# Patient Record
Sex: Female | Born: 1980 | Race: White | Hispanic: No | Marital: Married | Smoking: Never smoker
Health system: Southern US, Community
[De-identification: ages and names within clinical notes are randomized; demographics above are authoritative.]

---

## 2008-01-24 ENCOUNTER — Emergency Department: Payer: Self-pay | Admitting: Emergency Medicine

## 2009-04-20 ENCOUNTER — Ambulatory Visit: Payer: Self-pay | Admitting: Obstetrics & Gynecology

## 2009-04-21 ENCOUNTER — Observation Stay: Payer: Self-pay

## 2009-04-29 ENCOUNTER — Inpatient Hospital Stay: Payer: Self-pay

## 2011-05-31 ENCOUNTER — Ambulatory Visit: Payer: Self-pay

## 2011-06-01 ENCOUNTER — Inpatient Hospital Stay: Payer: Self-pay

## 2011-06-05 LAB — PATHOLOGY REPORT

## 2013-03-07 ENCOUNTER — Ambulatory Visit: Payer: Self-pay | Admitting: Family Medicine

## 2013-03-07 LAB — URINALYSIS, COMPLETE
Bilirubin,UR: NEGATIVE
Protein: 100
Specific Gravity: >=1.03 (ref 1.003–1.030)
WBC UR: >30 /HPF (ref 0–5)

## 2013-03-09 LAB — URINE CULTURE

## 2013-05-19 ENCOUNTER — Ambulatory Visit: Payer: Self-pay

## 2013-05-19 LAB — URINALYSIS, COMPLETE

## 2013-05-21 LAB — URINE CULTURE

## 2014-10-02 ENCOUNTER — Ambulatory Visit: Payer: Self-pay | Admitting: Family Medicine

## 2015-04-17 ENCOUNTER — Encounter: Payer: Self-pay | Admitting: Gynecology

## 2015-04-17 ENCOUNTER — Ambulatory Visit
Admission: EM | Admit: 2015-04-17 | Discharge: 2015-04-17 | Disposition: A | Discharge status: Home / Self Care | Payer: BLUE CROSS/BLUE SHIELD | Attending: Family Medicine | Admitting: Family Medicine

## 2015-04-17 DIAGNOSIS — B950 Streptococcus, group A, as the cause of diseases classified elsewhere: Secondary | ICD-10-CM | POA: Insufficient documentation

## 2015-04-17 DIAGNOSIS — J020 Streptococcal pharyngitis: Secondary | ICD-10-CM | POA: Diagnosis not present

## 2015-04-17 DIAGNOSIS — J029 Acute pharyngitis, unspecified: Secondary | ICD-10-CM | POA: Diagnosis present

## 2015-04-17 LAB — RAPID STREP SCREEN (MED CTR MEBANE ONLY): STREPTOCOCCUS, GROUP A SCREEN (DIRECT): POSITIVE — AB

## 2015-04-17 MED ORDER — LIDOCAINE VISCOUS 2 % MT SOLN: OROMUCOSAL | Status: DC

## 2015-04-17 MED ORDER — HYDROCODONE-ACETAMINOPHEN 5-325 MG PO TABS: 1.0000 | ORAL_TABLET | Freq: Four times a day (QID) | ORAL | Status: DC | PRN

## 2015-04-17 MED ORDER — PENICILLIN G BENZATHINE 1200000 UNIT/2ML IM SUSP
1.2000 10*6.[IU] | Freq: Once | INTRAMUSCULAR | Status: AC
  Administered 2015-04-17: 1.2 10*6.[IU] via INTRAMUSCULAR

## 2015-04-17 NOTE — ED Notes (Signed)
Patient c/o sore throat x 3 days.  

## 2015-04-17 NOTE — ED Provider Notes (Signed)
CSN: [id number]     Arrival date & time 04/17/15  6203 History   First MD Initiated Contact with Patient 04/17/15 1018     Chief Complaint  Patient presents with  . Sore Throat   (Consider location/radiation/quality/duration/timing/severity/associated sxs/prior Treatment) HPI Comments: 34 yo female with a 3 days h/o sore throat. Complains of pain with swallowing. Denies any difficulty swallowing, trouble breathing, fevers, chills, nasal congestion or cough.   Patient is a 34 y.o. female presenting with pharyngitis. The history is provided by the patient.  Sore Throat    History reviewed. No pertinent past medical history. Past Surgical History  Procedure Laterality Date  . Cesarean section      x2   No family history on file. History  Substance Use Topics  . Smoking status: Never Smoker   . Smokeless tobacco: Not on file  . Alcohol Use: No   OB History    No data available     Review of Systems  Allergies  Review of patient's allergies indicates no known allergies.  Home Medications   Prior to Admission medications   Medication Sig Start Date End Date Taking? Authorizing Provider  HYDROcodone-acetaminophen (NORCO/VICODIN) 5-325 MG per tablet Take 1-2 tablets by mouth every 6 (six) hours as needed. 04/17/15   Payton Mccallum, MD  lidocaine (XYLOCAINE) 2 % solution 20 mls gargle and spit q 6 hours prn 04/17/15   Payton Mccallum, MD   BP 121/67 mmHg  Pulse 97  Temp(Src) 97.8 F (36.6 C) (Oral)  Ht 5\' 6"  (1.676 m)  Wt 280 lb (127.007 kg)  BMI 45.21 kg/m2  SpO2 99%  LMP 03/27/2015 (Approximate) Physical Exam  Constitutional: She appears well-developed and well-nourished. No distress.  HENT:  Head: Normocephalic and atraumatic.  Nose: Nose normal.  Mouth/Throat: Mucous membranes are normal. Oropharyngeal exudate and posterior oropharyngeal erythema present. No posterior oropharyngeal edema or tonsillar abscesses.  Neck: Normal range of motion. Neck supple. No tracheal  deviation present. No thyromegaly present.  Lymphadenopathy:    She has no cervical adenopathy.  Skin: She is not diaphoretic.  Nursing note and vitals reviewed.   ED Course  Procedures (including critical care time) Labs Review Labs Reviewed  RAPID STREP SCREEN (NOT AT [facility]) - Abnormal; Notable for the following:    Streptococcus, Group A Screen (Direct) POSITIVE (*)    All other components within normal limits    Imaging Review No results found.   MDM   1. Strep pharyngitis    Discharge Medication List as of 04/17/2015 10:48 AM    START taking these medications   Details  HYDROcodone-acetaminophen (NORCO/VICODIN) 5-325 MG per tablet Take 1-2 tablets by mouth every 6 (six) hours as needed., Starting 04/17/2015, Until Discontinued, Print    lidocaine (XYLOCAINE) 2 % solution 20 mls gargle and spit q 6 hours prn, Normal      Plan: 1. Test results and diagnosis reviewed with patient 2. rx as per orders; risks, benefits, potential side effects reviewed with patient 3. Patient given Bicillin LA 1.52mU IM x 1 in clinic; tolerated well, no reactions 4. Recommend supportive treatment with salt water gargles, otc analgesics prn 4. F/u prn if symptoms worsen or don't improve    Payton Mccallum, MD 04/17/15 1051

## 2016-11-22 ENCOUNTER — Ambulatory Visit
Admission: EM | Admit: 2016-11-22 | Discharge: 2016-11-22 | Disposition: A | Discharge status: Home / Self Care | Payer: BLUE CROSS/BLUE SHIELD | Attending: Family Medicine | Admitting: Family Medicine

## 2016-11-22 ENCOUNTER — Encounter: Payer: Self-pay | Admitting: *Deleted

## 2016-11-22 DIAGNOSIS — R110 Nausea: Secondary | ICD-10-CM | POA: Diagnosis not present

## 2016-11-22 DIAGNOSIS — R6889 Other general symptoms and signs: Secondary | ICD-10-CM

## 2016-11-22 MED ORDER — HYDROCOD POLST-CPM POLST ER 10-8 MG/5ML PO SUER: 5.0000 mL | Freq: Two times a day (BID) | ORAL | 0 refills | Status: DC | PRN

## 2016-11-22 MED ORDER — FEXOFENADINE-PSEUDOEPHED ER 180-240 MG PO TB24: 1.0000 | ORAL_TABLET | Freq: Every day | ORAL | 0 refills | Status: DC

## 2016-11-22 MED ORDER — OSELTAMIVIR PHOSPHATE 75 MG PO CAPS: 75.0000 mg | ORAL_CAPSULE | Freq: Two times a day (BID) | ORAL | 0 refills | Status: DC

## 2016-11-22 MED ORDER — ONDANSETRON 8 MG PO TBDP: 8.0000 mg | ORAL_TABLET | Freq: Three times a day (TID) | ORAL | 0 refills | Status: DC | PRN

## 2016-11-22 NOTE — ED Triage Notes (Signed)
Non-productive cough, fever, N/V, runny nose since yesterday.

## 2016-11-22 NOTE — ED Provider Notes (Signed)
[facility]    CSN: [id number] Arrival date & time: 11/22/16  0957     History   Chief Complaint Chief Complaint  Patient presents with  . Cough  . Fever  . Emesis  . Nausea    HPI Michaela Taylor is a 36 y.o. female.   Patient is a morbidly obese white female 36 years old who became sick yesterday. Fever yesterday she does not know how high aching all over coughing and congestion. She denies a sore throat but states that the coughing was so bad that she started having upper abdominal pain and she thinks may pull something coughing so much. She does smoke unfortunately. She denies any chronic medical problems she's had 2 C-sections for childbirth. Denies any pertinent medical history no pertinent family medical history eat.   The history is provided by the patient.  Cough  Cough characteristics:  Non-productive Onset quality:  Sudden Duration:  2 days Progression:  Worsening Chronicity:  New Smoker: yes   Context: sick contacts and upper respiratory infection   Relieved by:  Nothing Worsened by:  Activity Associated symptoms: fever   Fever  Associated symptoms: cough and vomiting   Emesis  Associated symptoms: cough and fever     History reviewed. No pertinent past medical history.  There are no active problems to display for this patient.   Past Surgical History:  Procedure Laterality Date  . CESAREAN SECTION     x2  . CESAREAN SECTION      OB History    No data available       Home Medications    Prior to Admission medications   Medication Sig Start Date End Date Taking? Authorizing Provider  chlorpheniramine-HYDROcodone (TUSSIONEX PENNKINETIC ER) 10-8 MG/5ML SUER Take 5 mLs by mouth every 12 (twelve) hours as needed for cough. 11/22/16   Hassan RowanEugene Deaisa Merida, MD  fexofenadine-pseudoephedrine (ALLEGRA-D ALLERGY & CONGESTION) 180-240 MG 24 hr tablet Take 1 tablet by mouth daily. 11/22/16   Hassan RowanEugene Derinda Bartus, MD  HYDROcodone-acetaminophen (NORCO/VICODIN)  5-325 MG per tablet Take 1-2 tablets by mouth every 6 (six) hours as needed. 04/17/15   Payton Mccallumrlando Conty, MD  lidocaine (XYLOCAINE) 2 % solution 20 mls gargle and spit q 6 hours prn 04/17/15   Payton Mccallumrlando Conty, MD  ondansetron (ZOFRAN ODT) 8 MG disintegrating tablet Take 1 tablet (8 mg total) by mouth every 8 (eight) hours as needed for nausea or vomiting. 11/22/16   Hassan RowanEugene Tinsley Everman, MD  oseltamivir (TAMIFLU) 75 MG capsule Take 1 capsule (75 mg total) by mouth 2 (two) times daily. 11/22/16   Hassan RowanEugene Azora Bonzo, MD    Family History History reviewed. No pertinent family history.  Social History Social History  Substance Use Topics  . Smoking status: Never Smoker  . Smokeless tobacco: Never Used  . Alcohol use No     Allergies   Patient has no known allergies.   Review of Systems Review of Systems  Constitutional: Positive for fever.  Respiratory: Positive for cough.   Gastrointestinal: Positive for vomiting.  All other systems reviewed and are negative.    Physical Exam Triage Vital Signs ED Triage Vitals  Enc Vitals Group     BP 11/22/16 1108 (!) 143/86     Pulse Rate 11/22/16 1108 87     Resp 11/22/16 1108 16     Temp 11/22/16 1108 97.6 F (36.4 C)     Temp Source 11/22/16 1108 Oral     SpO2 11/22/16 1108 97 %  Weight 11/22/16 1111 250 lb (113.4 kg)     Height 11/22/16 1111 5\' 6"  (1.676 m)     Head Circumference --      Peak Flow --      Pain Score 11/22/16 1251 0     Pain Loc --      Pain Edu? --      Excl. in GC? --    No data found.   Updated Vital Signs BP (!) 143/86 (BP Location: Left Arm)   Pulse 87   Temp 97.6 F (36.4 C) (Oral)   Resp 16   Ht 5\' 6"  (1.676 m)   Wt 250 lb (113.4 kg)   SpO2 97%   BMI 40.35 kg/m   Visual Acuity Right Eye Distance:   Left Eye Distance:   Bilateral Distance:    Right Eye Near:   Left Eye Near:    Bilateral Near:     Physical Exam  Constitutional: She is oriented to person, place, and time. She appears well-developed and  well-nourished. No distress.  HENT:  Head: Normocephalic and atraumatic.  Right Ear: Hearing, tympanic membrane, external ear and ear canal normal.  Left Ear: Hearing, tympanic membrane, external ear and ear canal normal.  Nose: Mucosal edema present.  Mouth/Throat: Uvula is midline and oropharynx is clear and moist.  Eyes: EOM are normal. Pupils are equal, round, and reactive to light.  Neck: Normal range of motion. Neck supple.  Cardiovascular: Normal rate and regular rhythm.   Pulmonary/Chest: Effort normal and breath sounds normal.  Musculoskeletal: Normal range of motion.  Neurological: She is alert and oriented to person, place, and time.  Skin: Skin is warm. She is not diaphoretic.  Psychiatric: She has a normal mood and affect.  Vitals reviewed.    UC Treatments / Results  Labs (all labs ordered are listed, but only abnormal results are displayed) Labs Reviewed - No data to display  EKG  EKG Interpretation None       Radiology No results found.  Procedures Procedures (including critical care time)  Medications Ordered in UC Medications - No data to display   Initial Impression / Assessment and Plan / UC Course  I have reviewed the triage vital signs and the nursing notes.  Pertinent labs & imaging results that were available during my care of the patient were reviewed by me and considered in my medical decision making (see chart for details).    Patient appears to have the flu. We'll place on Tamiflu 75 mg twice a day plus next 1 teaspoon twice a day will also place patient on Allegra-D 1 tablet daily and will give her Zofran 8 mg to use when necessary for the nausea. She declined work note at this time.  Final Clinical Impressions(s) / UC Diagnoses   Final diagnoses:  Nausea  Flu-like symptoms    New Prescriptions Discharge Medication List as of 11/22/2016 12:47 PM    START taking these medications   Details  chlorpheniramine-HYDROcodone (TUSSIONEX  PENNKINETIC ER) 10-8 MG/5ML SUER Take 5 mLs by mouth every 12 (twelve) hours as needed for cough., Starting Wed 11/22/2016, Normal    fexofenadine-pseudoephedrine (ALLEGRA-D ALLERGY & CONGESTION) 180-240 MG 24 hr tablet Take 1 tablet by mouth daily., Starting Wed 11/22/2016, Normal    ondansetron (ZOFRAN ODT) 8 MG disintegrating tablet Take 1 tablet (8 mg total) by mouth every 8 (eight) hours as needed for nausea or vomiting., Starting Wed 11/22/2016, Normal    oseltamivir (TAMIFLU) 75 MG capsule  Take 1 capsule (75 mg total) by mouth 2 (two) times daily., Starting Wed 11/22/2016, Normal        Note: This dictation was prepared with Dragon dictation along with smaller phrase technology. Any transcriptional errors that result from this process are unintentional.   Hassan Rowan, MD 11/22/16 1309

## 2016-11-27 ENCOUNTER — Ambulatory Visit
Admission: EM | Admit: 2016-11-27 | Discharge: 2016-11-27 | Disposition: A | Discharge status: Home / Self Care | Payer: BLUE CROSS/BLUE SHIELD | Attending: Family Medicine | Admitting: Family Medicine

## 2016-11-27 DIAGNOSIS — H6692 Otitis media, unspecified, left ear: Secondary | ICD-10-CM | POA: Diagnosis not present

## 2016-11-27 DIAGNOSIS — J0100 Acute maxillary sinusitis, unspecified: Secondary | ICD-10-CM

## 2016-11-27 DIAGNOSIS — H60502 Unspecified acute noninfective otitis externa, left ear: Secondary | ICD-10-CM

## 2016-11-27 MED ORDER — CIPROFLOXACIN-DEXAMETHASONE 0.3-0.1 % OT SUSP: 4.0000 [drp] | Freq: Two times a day (BID) | OTIC | 0 refills | Status: DC

## 2016-11-27 MED ORDER — AMOXICILLIN-POT CLAVULANATE 875-125 MG PO TABS: 1.0000 | ORAL_TABLET | Freq: Two times a day (BID) | ORAL | 0 refills | Status: DC

## 2016-11-27 NOTE — ED Triage Notes (Signed)
Pt c/o facial pain, sinus congestion, she was dx with flu last week however she is having lots of nasal congestion and stuffiness. Facial Pain.

## 2016-11-27 NOTE — ED Provider Notes (Signed)
[facility] ____________________________________________  Time seen: Approximately 12:58 PM  I have reviewed the triage vital signs and the nursing notes.   HISTORY  Chief Complaint Nasal Congestion   HPI Michaela Taylor is a 36 y.o. female presenting for the complaints of nasal congestion, sinus pressure, sinus congestion and intermittent ear discomfort for the last 5 days. Patient reports she had the same symptoms in addition to cough, chills, body aches and fever just over a week ago present when she had the flu. Patient states that her flulike symptoms have improved but now with continued sinus discomfort and ear discomfort. Patient describes the discomfort to bilateral ears, left worse than right, with a sensation of achiness and mild muffled hearing. Denies Drainage, Tinnitus or abrupt decrease of hearing. Patient denies fevers in the last several days. Reports thick nasal drainage. Reports maxillary tenderness and congestion that intermittently radiates to her teeth. Continues to drink fluids well, decreased appetite. Reports over-the-counter Mucinex, cough and congestion medications help but no resolution.  Denies chest pain, shortness of breath, abdominal pain, dysuria, extremity pain, extremity swelling or rash. Denies other recent sickness. Denies recent antibiotic use.   Last menstrual.: Last week. Denies pregnancy.  History reviewed. No pertinent past medical history.  There are no active problems to display for this patient.   Past Surgical History:  Procedure Laterality Date  . CESAREAN SECTION     x2  . CESAREAN SECTION       No current facility-administered medications for this encounter.   Current Outpatient Prescriptions:  .  amoxicillin-clavulanate (AUGMENTIN) 875-125 MG tablet, Take 1 tablet by mouth every 12 (twelve) hours., Disp: 20 tablet, Rfl: 0 .  ciprofloxacin-dexamethasone (CIPRODEX) otic suspension, Place 4 drops into the left ear 2 (two)  times daily. For 7 days, Disp: 7.5 mL, Rfl: 0 .  fexofenadine-pseudoephedrine (ALLEGRA-D ALLERGY & CONGESTION) 180-240 MG 24 hr tablet, Take 1 tablet by mouth daily., Disp: 30 tablet, Rfl: 0 .  lidocaine (XYLOCAINE) 2 % solution, 20 mls gargle and spit q 6 hours prn, Disp: 100 mL, Rfl: 0 .  ondansetron (ZOFRAN ODT) 8 MG disintegrating tablet, Take 1 tablet (8 mg total) by mouth every 8 (eight) hours as needed for nausea or vomiting., Disp: 20 tablet, Rfl: 0 .  oseltamivir (TAMIFLU) 75 MG capsule, Take 1 capsule (75 mg total) by mouth 2 (two) times daily., Disp: 10 capsule, Rfl: 0  Allergies Codeine  History reviewed. No pertinent family history.  Social History Social History  Substance Use Topics  . Smoking status: Never Smoker  . Smokeless tobacco: Never Used  . Alcohol use No    Review of Systems Constitutional: As above. Eyes: No visual changes. ENT: No sore throat. Cardiovascular: Denies chest pain. Respiratory: Denies shortness of breath. Gastrointestinal: No abdominal pain.  No nausea, no vomiting.  No diarrhea.  No constipation. Genitourinary: Negative for dysuria. Musculoskeletal: Negative for back pain. Skin: Negative for rash. Neurological: Negative for headaches, focal weakness or numbness.  10-point ROS otherwise negative.  ____________________________________________   PHYSICAL EXAM:  VITAL SIGNS: ED Triage Vitals  Enc Vitals Group     BP 11/27/16 1206 (!) 159/87     Pulse Rate 11/27/16 1206 66     Resp 11/27/16 1206 18     Temp 11/27/16 1206 97.5 F (36.4 C)     Temp Source 11/27/16 1206 Oral     SpO2 11/27/16 1206 99 %     Weight 11/27/16 1212 250 lb (113.4 kg)  Height 11/27/16 1212 5\' 6"  (1.676 m)     Head Circumference --      Peak Flow --      Pain Score 11/27/16 1209 5     Pain Loc --      Pain Edu? --      Excl. in GC? --     Constitutional: Alert and oriented. Well appearing and in no acute distress. Eyes: Conjunctivae are normal.  PERRL. EOMI. Head: Atraumatic.Mild to moderate tenderness to palpation bilateral maxillary sinuses, left greater than right. No frontal sinus is palpation.. No swelling. No erythema.   Ears: Right: Nontender, no erythema, normal TM. Left: Mild tenderness nor movement, mild canal swelling and mild to moderate erythema, mild TM erythema and dullness, TM appears intact, no drainage. No surrounding erythema, swelling or tenderness bilaterally.  Nose: nasal congestion with bilateral nasal turbinate erythema and edema.   Mouth/Throat: Mucous membranes are moist.  Oropharynx non-erythematous.No tonsillar swelling or exudate.  Neck: No stridor.  No cervical spine tenderness to palpation. Hematological/Lymphatic/Immunilogical: No cervical lymphadenopathy. Cardiovascular: Normal rate, regular rhythm. Grossly normal heart sounds.  Good peripheral circulation. Respiratory: Normal respiratory effort.  No retractions.  No wheezes, rales or rhonchi. Good air movement.  Gastrointestinal: Soft and nontender.No CVA tenderness. Musculoskeletal: Ambulatory with steady gait. No cervical, thoracic or lumbar tenderness to palpation.  Neurologic:  Normal speech and language.No gait instability. Skin:  Skin is warm, dry and intact. No rash noted. Psychiatric: Mood and affect are normal. Speech and behavior are normal.  ___________________________________________   LABS (all labs ordered are listed, but only abnormal results are displayed)  Labs Reviewed - No data to display   PROCEDURES Procedures    INITIAL IMPRESSION / ASSESSMENT AND PLAN / ED COURSE  Pertinent labs & imaging results that were available during my care of the patient were reviewed by me and considered in my medical decision making (see chart for details).  Well-appearing patient. No acute distress. Patient reports recent influenza in which she completed Tamiflu. Presenting for continued sinus drainage and discomfort. Suspect sinusitis with  left otitis media and left otitis externa. Will treat patient with oral Augmentin and ciprodex otic. Encourage supportive care, rest, fluids.Discussed indication, risks and benefits of medications with patient.  Discussed follow up with Primary care physician this week. Discussed follow up and return parameters including no resolution or any worsening concerns. Patient verbalized understanding and agreed to plan.   ____________________________________________   FINAL CLINICAL IMPRESSION(S) / ED DIAGNOSES  Final diagnoses:  Acute maxillary sinusitis, recurrence not specified  Acute otitis externa of left ear, unspecified type  Left otitis media, unspecified otitis media type     Discharge Medication List as of 11/27/2016  1:00 PM    START taking these medications   Details  amoxicillin-clavulanate (AUGMENTIN) 875-125 MG tablet Take 1 tablet by mouth every 12 (twelve) hours., Starting Mon 11/27/2016, Normal    ciprofloxacin-dexamethasone (CIPRODEX) otic suspension Place 4 drops into the left ear 2 (two) times daily. For 7 days, Starting Mon 11/27/2016, Normal        Note: This dictation was prepared with Dragon dictation along with smaller phrase technology. Any transcriptional errors that result from this process are unintentional.         Renford Dills, NP 11/27/16 1846

## 2016-11-27 NOTE — Discharge Instructions (Signed)
Take medication as prescribed. Rest. Drink plenty of fluids. Keep ears dry.  Follow up with your primary care physician this week as needed. Return to Urgent care for new or worsening concerns.

## 2016-11-29 ENCOUNTER — Emergency Department: Payer: BLUE CROSS/BLUE SHIELD

## 2016-11-29 ENCOUNTER — Encounter: Payer: Self-pay | Admitting: Emergency Medicine

## 2016-11-29 ENCOUNTER — Ambulatory Visit
Admission: EM | Admit: 2016-11-29 | Discharge: 2016-11-29 | Disposition: A | Discharge status: Other Acute Inpatient Hospital | Payer: BLUE CROSS/BLUE SHIELD | Attending: Family Medicine | Admitting: Family Medicine

## 2016-11-29 ENCOUNTER — Emergency Department
Admission: EM | Admit: 2016-11-29 | Discharge: 2016-11-29 | Disposition: A | Discharge status: Home / Self Care | Payer: BLUE CROSS/BLUE SHIELD | Attending: Emergency Medicine | Admitting: Emergency Medicine

## 2016-11-29 DIAGNOSIS — R220 Localized swelling, mass and lump, head: Secondary | ICD-10-CM | POA: Diagnosis not present

## 2016-11-29 DIAGNOSIS — H05012 Cellulitis of left orbit: Secondary | ICD-10-CM | POA: Insufficient documentation

## 2016-11-29 DIAGNOSIS — H5712 Ocular pain, left eye: Secondary | ICD-10-CM

## 2016-11-29 DIAGNOSIS — L03213 Periorbital cellulitis: Secondary | ICD-10-CM

## 2016-11-29 LAB — CBC WITH DIFFERENTIAL/PLATELET
BASOS ABS: 0 10*3/uL (ref 0–0.1)
Band Neutrophils: 1 %
Basophils Relative: 0 %
Blasts: 0 %
Eosinophils Absolute: 0 10*3/uL (ref 0–0.7)
Eosinophils Relative: 0 %
HEMATOCRIT: 41.2 % (ref 35.0–47.0)
HEMOGLOBIN: 13.9 g/dL (ref 12.0–16.0)
Lymphocytes Relative: 17 %
Lymphs Abs: 3.8 10*3/uL — ABNORMAL HIGH (ref 1.0–3.6)
MCH: 28.4 pg (ref 26.0–34.0)
MCHC: 33.7 g/dL (ref 32.0–36.0)
MCV: 84.3 fL (ref 80.0–100.0)
MONO ABS: 1.3 10*3/uL — AB (ref 0.2–0.9)
Metamyelocytes Relative: 0 %
Monocytes Relative: 6 %
Myelocytes: 0 %
NEUTROS PCT: 76 %
NRBC: 0 /100{WBCs}
Neutro Abs: 17.2 10*3/uL — ABNORMAL HIGH (ref 1.4–6.5)
Other: 0 %
PROMYELOCYTES ABS: 0 %
Platelets: 355 10*3/uL (ref 150–440)
RBC: 4.89 MIL/uL (ref 3.80–5.20)
RDW: 14.1 % (ref 11.5–14.5)
WBC: 22.3 10*3/uL — AB (ref 3.6–11.0)

## 2016-11-29 LAB — URINALYSIS, COMPLETE (UACMP) WITH MICROSCOPIC
BACTERIA UA: NONE SEEN
BILIRUBIN URINE: NEGATIVE
GLUCOSE, UA: NEGATIVE mg/dL
KETONES UR: 5 mg/dL — AB
NITRITE: NEGATIVE
Specific Gravity, Urine: 1.028 (ref 1.005–1.030)
pH: 5 (ref 5.0–8.0)

## 2016-11-29 LAB — BASIC METABOLIC PANEL
Anion gap: 9 (ref 5–15)
BUN: 11 mg/dL (ref 6–20)
CALCIUM: 8.8 mg/dL — AB (ref 8.9–10.3)
CO2: 28 mmol/L (ref 22–32)
Chloride: 99 mmol/L — ABNORMAL LOW (ref 101–111)
Creatinine, Ser: 0.87 mg/dL (ref 0.44–1.00)
GFR calc non Af Amer: >60 mL/min (ref >60)
Glucose, Bld: 127 mg/dL — ABNORMAL HIGH (ref 65–99)
Potassium: 3.4 mmol/L — ABNORMAL LOW (ref 3.5–5.1)
SODIUM: 136 mmol/L (ref 135–145)

## 2016-11-29 LAB — CK: Total CK: 25 U/L — ABNORMAL LOW (ref 38–234)

## 2016-11-29 LAB — POCT PREGNANCY, URINE: Preg Test, Ur: NEGATIVE

## 2016-11-29 MED ORDER — IBUPROFEN 200 MG PO TABS: 600.0000 mg | ORAL_TABLET | Freq: Four times a day (QID) | ORAL | 0 refills | Status: DC | PRN

## 2016-11-29 MED ORDER — KETOROLAC TROMETHAMINE 60 MG/2ML IM SOLN
15.0000 mg | Freq: Once | INTRAMUSCULAR | Status: AC
  Administered 2016-11-29: 15 mg via INTRAMUSCULAR
  Filled 2016-11-29: qty 2

## 2016-11-29 MED ORDER — ONDANSETRON 4 MG PO TBDP
4.0000 mg | ORAL_TABLET | Freq: Once | ORAL | Status: AC
  Administered 2016-11-29: 4 mg via ORAL

## 2016-11-29 MED ORDER — CLINDAMYCIN HCL 150 MG PO CAPS: 450.0000 mg | ORAL_CAPSULE | Freq: Three times a day (TID) | ORAL | 0 refills | Status: DC

## 2016-11-29 MED ORDER — IOPAMIDOL (ISOVUE-300) INJECTION 61%
75.0000 mL | Freq: Once | INTRAVENOUS | Status: AC | PRN
Start: 2016-11-29 — End: 2016-11-29
  Administered 2016-11-29: 75 mL via INTRAVENOUS

## 2016-11-29 MED ORDER — OXYCODONE-ACETAMINOPHEN 5-325 MG PO TABS: 1.0000 | ORAL_TABLET | Freq: Four times a day (QID) | ORAL | 0 refills | Status: DC | PRN

## 2016-11-29 NOTE — ED Triage Notes (Signed)
Patient c/o left eye swelling that started Monday night.

## 2016-11-29 NOTE — ED Notes (Signed)
Pt c/o swelling to LFT eye, eye is noted to be very swollen shut and red. Pt states URI all week, unknown fever, denies body aches. Pt A&Ox4.

## 2016-11-29 NOTE — Discharge Instructions (Signed)
Go directly to Emergency room as discussed.  °

## 2016-11-29 NOTE — ED Provider Notes (Signed)
[facility] ____________________________________________  Time seen: Approximately 2:33 PM  I have reviewed the triage vital signs and the nursing notes.   HISTORY  Chief Complaint Facial Swelling (left eye)   HPI Michaela Taylor is a 36 y.o. female  presenting with has not bedside for complaint of left facial and eye swelling, redness and pain that started Monday night and quickly worsened on Tuesday. Patient reports being seen in urgent care Monday for sinus congestion, sinus drainage and ear discomfort. Patient states that she was started on Augmentin and Ciprodex for sinusitis and left ear infection. Patient denies any other swelling, rash or other changes. Denies shortness of breath, mouth swelling, lip swelling, oral swelling, wheezing. Denies known fevers, but reports has been taking frequent Tylenol and ibuprofen to assist with pain.  Patient reports having moderate to severe left headache and left eye pain. Patient reports since yesterday unable to open left eye.Reports some light sensitivity and nausea. Denies vomiting.Denies stiff neck, neck or back pain. Denies trauma or injury. Denies medication or chemical exposure to eyes. Denies any other known triggers.  Denies chest pain, shortness of breath, abdominal pain, dysuria, extremity pain, extremity swelling or rash.   History reviewed. No pertinent past medical history.  There are no active problems to display for this patient.   Past Surgical History:  Procedure Laterality Date  . CESAREAN SECTION     x2  . CESAREAN SECTION       No current facility-administered medications for this encounter.   Current Outpatient Prescriptions:  .  amoxicillin-clavulanate (AUGMENTIN) 875-125 MG tablet, Take 1 tablet by mouth every 12 (twelve) hours., Disp: 20 tablet, Rfl: 0 .  ciprofloxacin-dexamethasone (CIPRODEX) otic suspension, Place 4 drops into the left ear 2 (two) times daily. For 7 days, Disp: 7.5 mL, Rfl: 0 .   fexofenadine-pseudoephedrine (ALLEGRA-D ALLERGY & CONGESTION) 180-240 MG 24 hr tablet, Take 1 tablet by mouth daily., Disp: 30 tablet, Rfl: 0 .  lidocaine (XYLOCAINE) 2 % solution, 20 mls gargle and spit q 6 hours prn, Disp: 100 mL, Rfl: 0 .  ondansetron (ZOFRAN ODT) 8 MG disintegrating tablet, Take 1 tablet (8 mg total) by mouth every 8 (eight) hours as needed for nausea or vomiting., Disp: 20 tablet, Rfl: 0  Allergies Codeine  History reviewed. No pertinent family history.  Social History Social History  Substance Use Topics  . Smoking status: Never Smoker  . Smokeless tobacco: Never Used  . Alcohol use No    Review of Systems Constitutional: No fever/chills Eyes: As above. ENT: No sore throat. Cardiovascular: Denies chest pain. Respiratory: Denies shortness of breath. Gastrointestinal: No abdominal pain.  No nausea, no vomiting.  No diarrhea.  No constipation. Genitourinary: Negative for dysuria. Musculoskeletal: Negative for back pain. Skin: As above. Neurological: Negative for focal weakness or numbness.  10-point ROS otherwise negative.  ____________________________________________   PHYSICAL EXAM:  VITAL SIGNS: ED Triage Vitals  Enc Vitals Group     BP 11/29/16 1355 124/63     Pulse Rate 11/29/16 1355 65     Resp 11/29/16 1355 16     Temp 11/29/16 1355 97.5 F (36.4 C)     Temp Source 11/29/16 1355 Oral     SpO2 11/29/16 1355 99 %     Weight 11/29/16 1357 260 lb (117.9 kg)     Height 11/29/16 1357 5\' 6"  (1.676 m)     Head Circumference --      Peak Flow --  Pain Score 11/29/16 1358 9     Pain Loc --      Pain Edu? --      Excl. in GC? --    Constitutional: Alert and oriented. Well appearing and in no acute distress. Eyes:  PERRL. See below. Left eye injected, no drainage. Left eye unable to fully assess EOMs, appears to have limited EOMs to left eye. Right eye EOM intact.  Head: Atraumatic.Mild tenderness to palpation right frontal and maxillary  sinuses. Left facial erythema and swelling with extensive eyelid swelling to left eye, moderate tenderness to palpation along left face, no erythema extending to left ear.   Ears: Right: nontender, no erythema, normal appearance. Left: nontender, mild erythema, no swelling, no drainage. Nonerythematous.    Nose: nasal congestion with bilateral nasal turbinate erythema and edema.   Mouth/Throat: Mucous membranes are moist.  Oropharynx non-erythematous.No tonsillar swelling or exudate. No oral, lip, tongue swelling noted.  Neck: No stridor.  No cervical spine tenderness to palpation. Hematological/Lymphatic/Immunilogical: No cervical lymphadenopathy. Cardiovascular: Normal rate, regular rhythm. Grossly normal heart sounds.  Good peripheral circulation. Respiratory: Normal respiratory effort.  No retractions. No wheezes, rales or rhonchi. Good air movement.  Gastrointestinal: Soft and nontender.  Musculoskeletal: No cervical, thoracic or lumbar tenderness to palpation.  Neurologic:  Normal speech and language. No gait instability. Skin:  Skin is warm, dry and intact. No rash noted. Psychiatric: Mood and affect are normal. Speech and behavior are normal.  ___________________________________________   LABS (all labs ordered are listed, but only abnormal results are displayed)  Labs Reviewed - No data to display  PROCEDURES Procedures   INITIAL IMPRESSION / ASSESSMENT AND PLAN / ED COURSE  Pertinent labs & imaging results that were available during my care of the patient were reviewed by me and considered in my medical decision making (see chart for details).  Patient with left facial erythema and swelling with extensive eyelid swelling to left eye. Discussed in detail suspect and concern for orbital cellulitis. Recommend to promptly proceed to emergency room. Patient and husband agree to this. Patient states that her husband will drive her directly to the emergency room. States she'll go to  [facility]. Insurance claims handlerJill RN charge nurse [facility] called and given report. Patient stable at time of discharge.   _________   FINAL CLINICAL IMPRESSION(S) / ED DIAGNOSES  Final diagnoses:  Left facial swelling  Left eye pain     Discharge Medication List as of 11/29/2016  2:31 PM      Note: This dictation was prepared with Dragon dictation along with smaller phrase technology. Any transcriptional errors that result from this process are unintentional.         Renford DillsLindsey Yuta Cipollone, NP 11/29/16 1512    Renford DillsLindsey Lauretta Sallas, NP 11/29/16 1513

## 2016-11-29 NOTE — ED Triage Notes (Signed)
Dx with the flu last week and started having left eye swelling on Monday, worse today. Pt is unable to open her eye voluntary, only with her fingers. No vision changes. Swelling and redness noted to left eye.

## 2016-11-29 NOTE — ED Provider Notes (Signed)
[facility] Emergency Department Provider Note  ____________________________________________  Time seen: Approximately 7:59 PM  I have reviewed the triage vital signs and the nursing notes.   HISTORY  Chief Complaint Facial Swelling    HPI [name] is a 36 y.o. female who complains of left facial and left eye swelling for the past 2 days. This is rapidly progressed. She was initially being treated for sinusitis with Augmentin and Ciprodex. She has pain in the eye and worse with eye movement. No vision change. Denies fever. Has left-sided headache as well. No vomiting numbness [place]ingley weakness or neck Stiffness.  No trauma.    History reviewed. No pertinent past medical history.   There are no active problems to display for this patient.    Past Surgical History:  Procedure Laterality Date  . CESAREAN SECTION     x2  . CESAREAN SECTION       Prior to Admission medications   Medication Sig Start Date End Date Taking? Authorizing Provider  amoxicillin-clavulanate (AUGMENTIN) 875-125 MG tablet Take 1 tablet by mouth every 12 (twelve) hours. 11/27/16   Renford Dills, NP  ciprofloxacin-dexamethasone (CIPRODEX) otic suspension Place 4 drops into the left ear 2 (two) times daily. For 7 days 11/27/16   Renford Dills, NP  fexofenadine-pseudoephedrine (ALLEGRA-D ALLERGY & CONGESTION) 180-240 MG 24 hr tablet Take 1 tablet by mouth daily. 11/22/16   Hassan Rowan, MD  lidocaine (XYLOCAINE) 2 % solution 20 mls gargle and spit q 6 hours prn 04/17/15   Payton Mccallum, MD  ondansetron (ZOFRAN ODT) 8 MG disintegrating tablet Take 1 tablet (8 mg total) by mouth every 8 (eight) hours as needed for nausea or vomiting. 11/22/16   Hassan Rowan, MD     Allergies Codeine   No family history on file.  Social History Social History  Substance Use Topics  . Smoking status: Never Smoker  . Smokeless tobacco: Never Used  . Alcohol use No    Review of  Systems  Constitutional:   No fever or chills.  ENT:   No sore throat. No rhinorrhea. Cardiovascular:   No chest pain. Respiratory:   No dyspnea or cough. Gastrointestinal:   Negative for abdominal pain, vomiting and diarrhea.  Genitourinary:   Negative for dysuria or difficulty urinating. Musculoskeletal:   Negative for focal pain or swelling Neurological:   Positive headache I swelling and eye pain on the left. 10-point ROS otherwise negative.  ____________________________________________   PHYSICAL EXAM:  VITAL SIGNS: ED Triage Vitals  Enc Vitals Group     BP 11/29/16 1535 (!) 165/85     Pulse Rate 11/29/16 1535 69     Resp 11/29/16 1535 20     Temp 11/29/16 1535 97.9 F (36.6 C)     Temp Source 11/29/16 1535 Oral     SpO2 11/29/16 1535 95 %     Weight --      Height --      Head Circumference --      Peak Flow --      Pain Score 11/29/16 1536 9     Pain Loc --      Pain Edu? --      Excl. in GC? --     Vital signs reviewed, nursing assessments reviewed.   Constitutional:   Alert and oriented. Not in distress. Eyes:   No scleral icterus. No conjunctival pallor. PERRL. no APD. painful EOM, EOMs intact..  No nystagmus. ENT   Head:   Normocephalic  and atraumatic. There is discoloration and swelling of the left upper and lower eyelids. This is tender to the touch. There is no crepitus. There is pain with gentle palpation of the eye globe.   Nose:   Positive nasal congestion. No septal hematoma   Mouth/Throat:   MMM, no pharyngeal erythema. No peritonsillar mass.    Neck:   No stridor. No SubQ emphysema. No meningismus. Hematological/Lymphatic/Immunilogical:   No cervical lymphadenopathy. Cardiovascular:   RRR. Symmetric bilateral radial and DP pulses.  No murmurs.  Respiratory:   Normal respiratory effort without tachypnea nor retractions. Breath sounds are clear and equal bilaterally. No wheezes/rales/rhonchi. Gastrointestinal:   Soft and nontender. Non  distended. There is no CVA tenderness.  No rebound, rigidity, or guarding. Genitourinary:   deferred Musculoskeletal:   Nontender with normal range of motion in all extremities. No joint effusions.  No lower extremity tenderness.  No edema. Neurologic:   Normal speech and language.  CN 2-10 normal. Motor grossly intact. No gross focal neurologic deficits are appreciated.  Skin:    Skin is warm, dry and intact. No rash noted.  No petechiae, purpura, or bullae.  ____________________________________________    LABS (pertinent positives/negatives) (all labs ordered are listed, but only abnormal results are displayed) Labs Reviewed  BASIC METABOLIC PANEL - Abnormal; Notable for the following:       Result Value   Potassium 3.4 (*)    Chloride 99 (*)    Glucose, Bld 127 (*)    Calcium 8.8 (*)    All other components within normal limits  CBC WITH DIFFERENTIAL/PLATELET - Abnormal; Notable for the following:    WBC 22.3 (*)    All other components within normal limits  URINALYSIS, COMPLETE (UACMP) WITH MICROSCOPIC - Abnormal; Notable for the following:    Color, Urine AMBER (*)    APPearance CLOUDY (*)    Hgb urine dipstick LARGE (*)    Ketones, ur 5 (*)    Protein, ur >=300 (*)    Leukocytes, UA TRACE (*)    Squamous Epithelial / LPF 6-30 (*)    All other components within normal limits  CK  POCT PREGNANCY, URINE   ____________________________________________   EKG    ____________________________________________    RADIOLOGY  CT orbits pending  ____________________________________________   PROCEDURES Procedures  ____________________________________________   INITIAL IMPRESSION / ASSESSMENT AND PLAN / ED COURSE  Pertinent labs & imaging results that were available during my care of the patient were reviewed by me and considered in my medical decision making (see chart for details).  Patient presents with left eye swelling. White blood cell count of 22,000.  On Augmentin and Ciprodex for sinusitis and ear infection. No evidence of PTA RPA or meningitis encephalitis or brain abscess. Afebrile. CT pending to evaluate for orbital cellulitis. If negative, I think patient can be discharge home to take NSAIDs and follow up with primary care. Care of patient signed out to Dr. Don PerkingVeronese to follow-up on CT.       ____________________________________________   FINAL CLINICAL IMPRESSION(S) / ED DIAGNOSES  Final diagnoses:  Acute left eye pain      New Prescriptions   No medications on file     Portions of this note were generated with dragon dictation software. Dictation errors may occur despite best attempts at proofreading.    Sharman CheekPhillip Esbeydi Manago, MD 11/29/16 2023

## 2018-08-23 IMAGING — CT CT ORBITS W/ CM
3 series · 14 of 47 positions shown, 16 images · IV contrast (iopamidol)
Comparison: None.

CLINICAL DATA: Left eye swelling

EXAM:
CT ORBITS WITH CONTRAST
TECHNIQUE: Multidetector CT images was performed according to the standard
protocol following intravenous contrast administration.
CONTRAST:  75mL LTC1BO-CAA IOPAMIDOL (LTC1BO-CAA) INJECTION 61%

[Series 3: orbits 2.0 h30s st · axial · 0.37mm/px · z∈[+600,+696]mm · 8 of 56 slices shown, 10 images]
[im 4/56  brain]
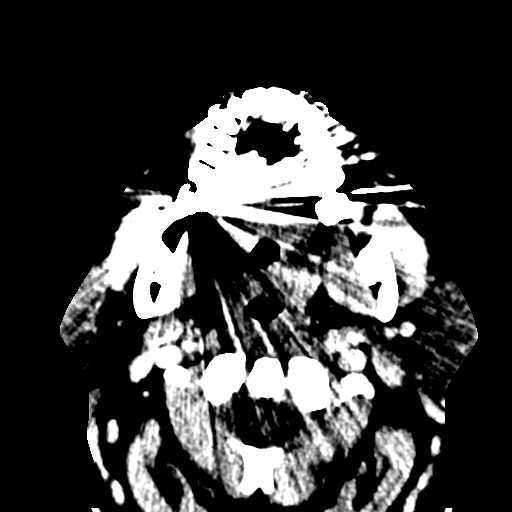
[im 4/56  bone]
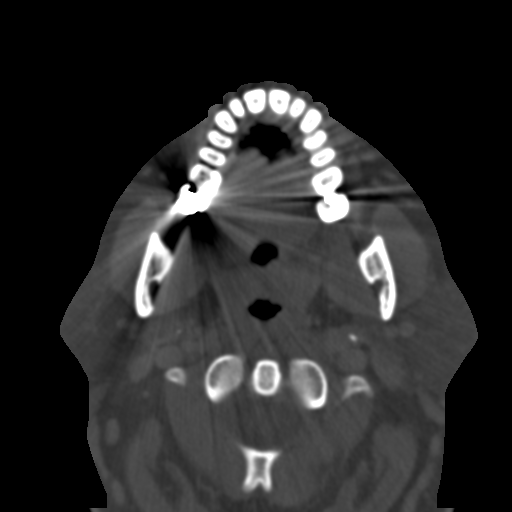
[im 12/56  bone]
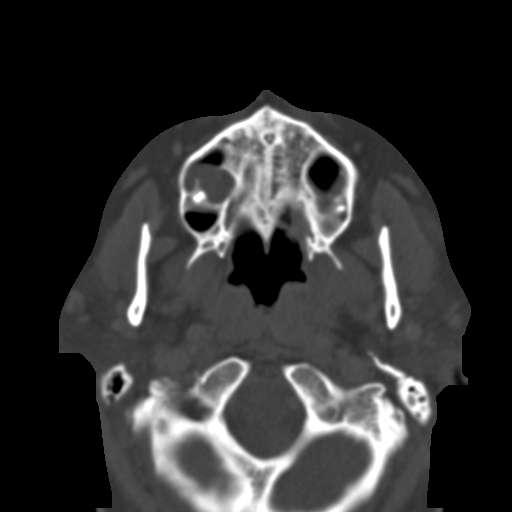
[im 18/56  bone]
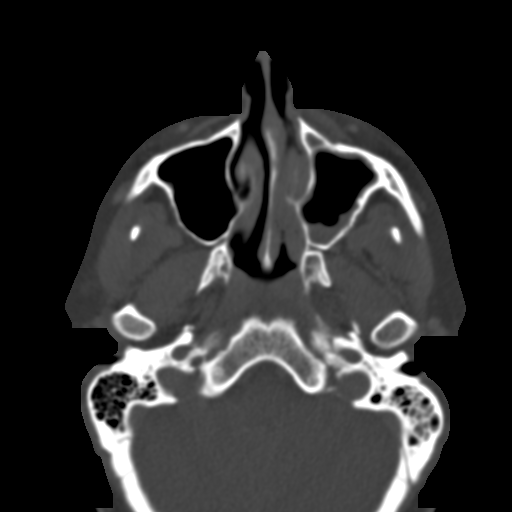
[im 25/56  bone]
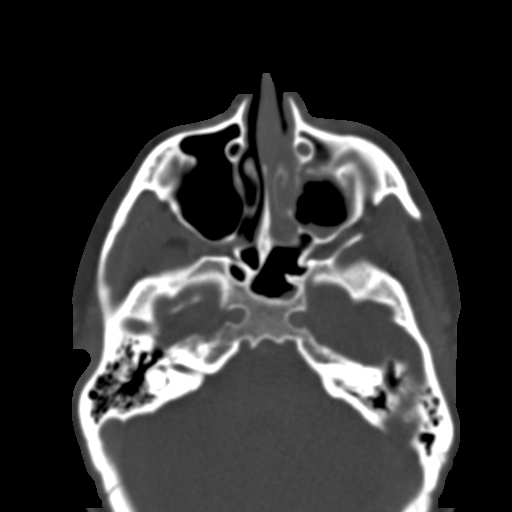
[im 31/56  brain]
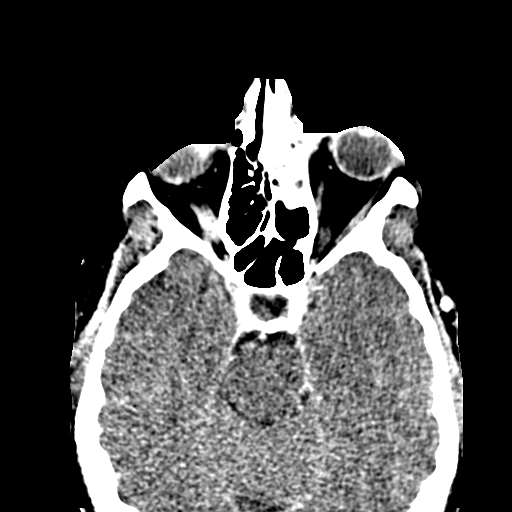
[im 31/56  bone]
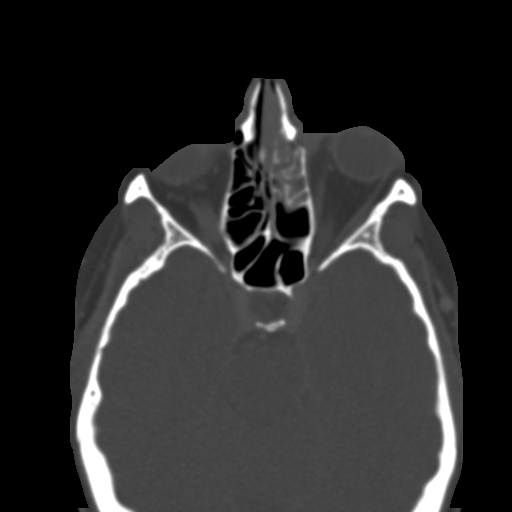
[im 38/56  bone]
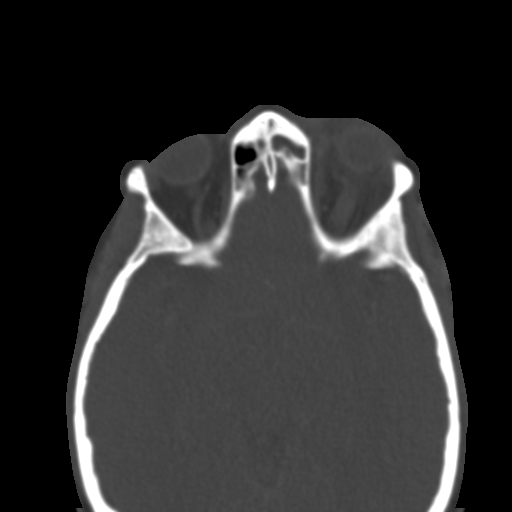
[im 44/56  bone]
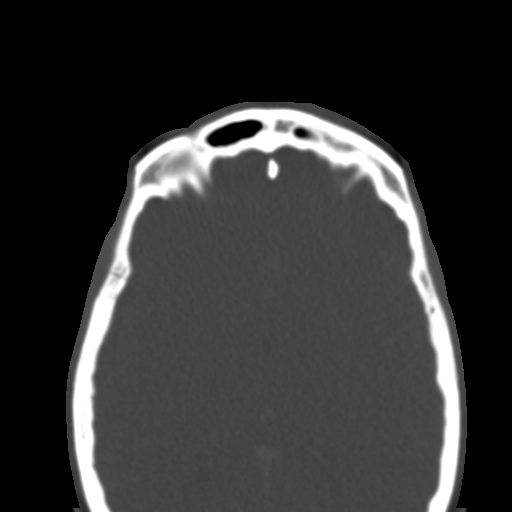
[im 52/56  bone]
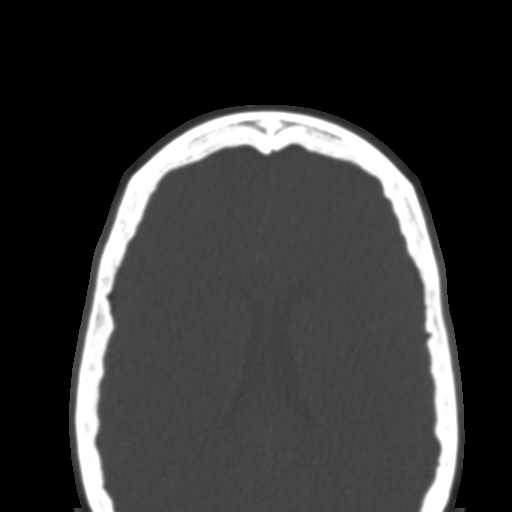

[Series 8: orbits 2.0 coronal · coronal · 0.24mm/px · 3 of 78 slices shown]
[im 26/78  bone]
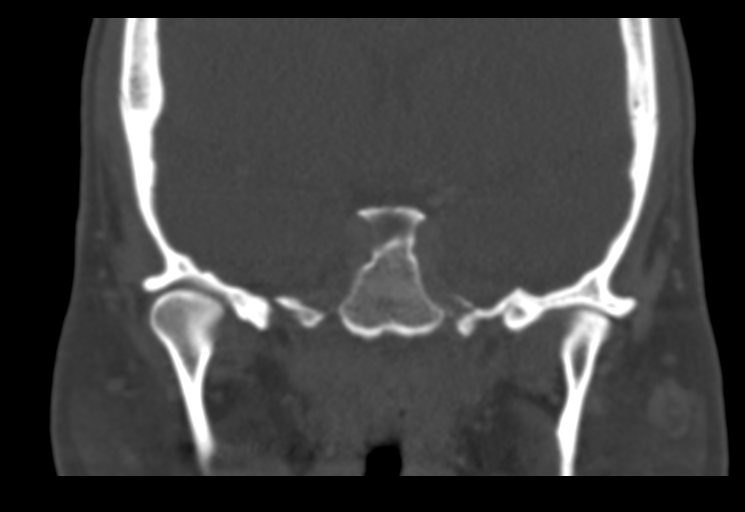
[im 35/78  bone]
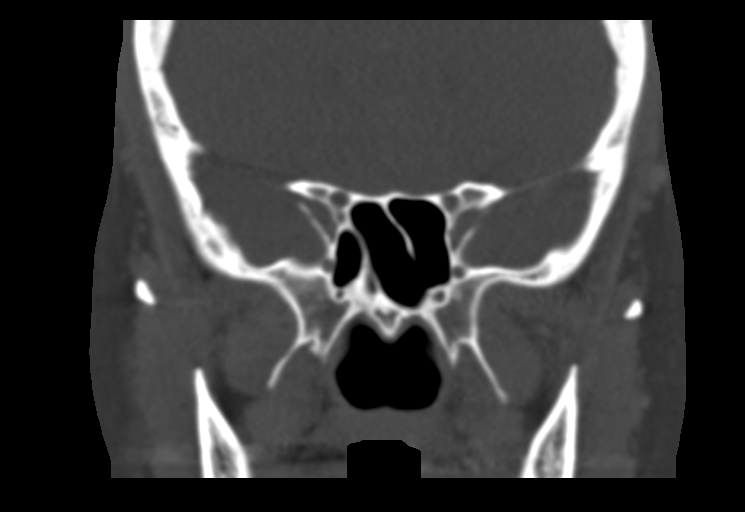
[im 43/78  bone]
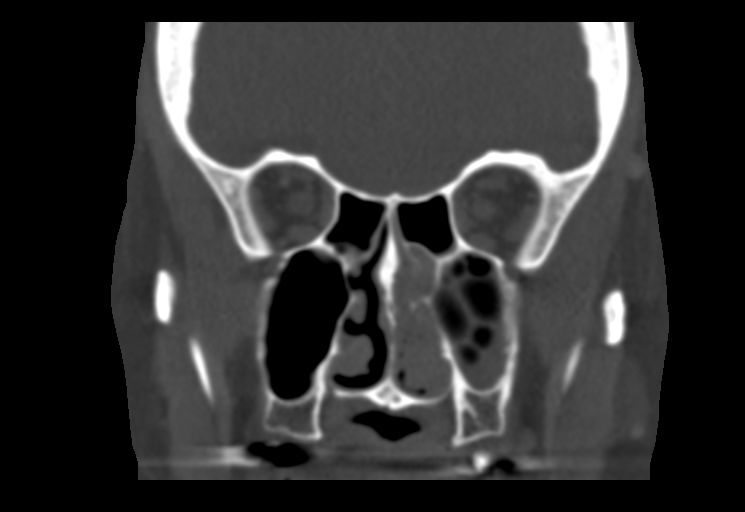

[Series 9: orbits 2.0 sagittal · sagittal · 0.26mm/px · 3 of 80 slices shown]
[im 27/80  bone]
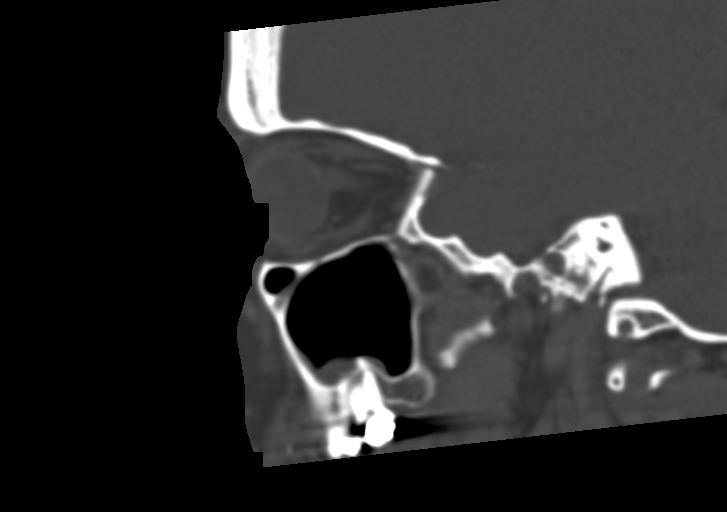
[im 40/80  bone]
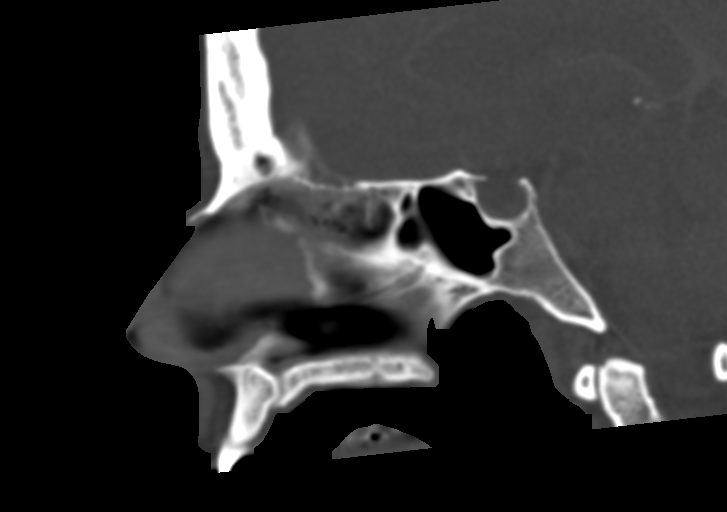
[im 53/80  bone]
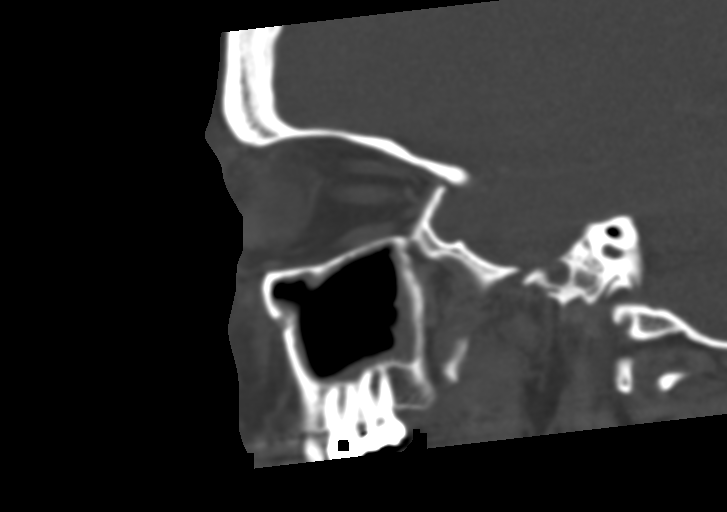

[14 of 47 positions shown; findings below may reference images not displayed]

FINDINGS: Orbits:

--Globes: Normal.

--Bony orbit: Normal.

--Preseptal soft tissues: There is thickening and edema of the
preseptal periorbital soft tissues on the left. The right side is
normal.

--Intra- and extraconal orbital fat: Normal. No inflammatory
stranding.

--Optic nerves: Normal.

--Lacrimal glands and fossae: Normal.

--Extraocular muscles: Normal.

Visualized brain: Normal.

Visualized paranasal sinuses: There is near complete opacification
of the anterior left ethmoid air cells with fluid in the left
maxillary sinus.

Visualized skull: Normal.
IMPRESSION: 1. Left periorbital cellulitis without abscess formation or
postseptal involvement.
2. Sinusitis involving the left frontal, anterior ethmoid and
maxillary sinuses.

## 2020-06-16 ENCOUNTER — Encounter: Payer: Self-pay | Admitting: Emergency Medicine

## 2020-06-16 ENCOUNTER — Ambulatory Visit
Admission: EM | Admit: 2020-06-16 | Discharge: 2020-06-16 | Disposition: A | Discharge status: Home / Self Care | Payer: BLUE CROSS/BLUE SHIELD | Attending: Emergency Medicine | Admitting: Emergency Medicine

## 2020-06-16 ENCOUNTER — Other Ambulatory Visit: Payer: Self-pay

## 2020-06-16 DIAGNOSIS — R82998 Other abnormal findings in urine: Secondary | ICD-10-CM | POA: Insufficient documentation

## 2020-06-16 DIAGNOSIS — R300 Dysuria: Secondary | ICD-10-CM | POA: Insufficient documentation

## 2020-06-16 LAB — URINALYSIS, COMPLETE (UACMP) WITH MICROSCOPIC

## 2020-06-16 MED ORDER — PHENAZOPYRIDINE HCL 200 MG PO TABS: 200.0000 mg | ORAL_TABLET | Freq: Three times a day (TID) | ORAL | 0 refills | Status: AC | PRN

## 2020-06-16 MED ORDER — IBUPROFEN 600 MG PO TABS: 600.0000 mg | ORAL_TABLET | Freq: Four times a day (QID) | ORAL | 0 refills | Status: AC | PRN

## 2020-06-16 MED ORDER — TAMSULOSIN HCL 0.4 MG PO CAPS: 0.4000 mg | ORAL_CAPSULE | Freq: Every day | ORAL | 0 refills | Status: AC

## 2020-06-16 NOTE — Discharge Instructions (Signed)
I suspect that your pressures are coming from kidney stones.  You have crystals in your urine that are suggestive of kidney stones.  I am starting you on Pyridium which will help with your symptoms and Flomax which will help you pass a kidney stone.  600 mg of ibuprofen combined with the 1000 mg of Tylenol 3-4 times a day as needed for pain.  This will also help pass kidney stone.  Push fluids.  We will contact you if your urine comes back positive for urinary tract infection and call in the appropriate antibiotics at that time.

## 2020-06-16 NOTE — ED Triage Notes (Signed)
Pt c/o dysuria, lower abdominal pain, urinary frequency. Started about 3 days ago. Denies fever or lower back pain. She states she took AZO this morning.

## 2020-06-16 NOTE — ED Provider Notes (Signed)
HPI  SUBJECTIVE:  Michaela Taylor is a 39 y.o. female who presents with 3 days of dysuria, odorous urine, low midline abdominal pain/pressure.  Patient states that symptoms started after not drinking a lot of fluids on a hot day.  No urinary urgency, frequency, vomiting, fevers.  No back pain.  No vaginal odor, bleeding, discharge, genital rash.  She is in a long-term monogamous relationship with her husband who is asymptomatic.  STDs are not a concern today. No Antibiotics in the past month.  No antipyretic in the past 6 hours.  She tried Azo this morning with some improvement in her symptoms.  No aggravating factors.  She has a past medical history of UTIs and states this feels similar to that.  She also has a history of nonobstructing nephrolithiasis.  No history of pyelonephritis, diabetes, hypertension, STDs, BV, yeast.  LMP: 3 weeks ago.  Denies possibility being pregnant.  PMD: [facility].    History reviewed. No pertinent past medical history.  Past Surgical History:  Procedure Laterality Date  . CESAREAN SECTION     x2  . CESAREAN SECTION      History reviewed. No pertinent family history.  Social History   Tobacco Use  . Smoking status: Never Smoker  . Smokeless tobacco: Never Used  Vaping Use  . Vaping Use: Never assessed  Substance Use Topics  . Alcohol use: No  . Drug use: No    No current facility-administered medications for this encounter.  Current Outpatient Medications:  .  ibuprofen (ADVIL) 600 MG tablet, Take 1 tablet (600 mg total) by mouth every 6 (six) hours as needed., Disp: 30 tablet, Rfl: 0 .  tamsulosin (FLOMAX) 0.4 MG CAPS capsule, Take 1 capsule (0.4 mg total) by mouth at bedtime for 7 days., Disp: 7 capsule, Rfl: 0  Allergies  Allergen Reactions  . Codeine Nausea Only     ROS  As noted in HPI.   Physical Exam  BP (!) 157/88 (BP Location: Right Arm)   Pulse 78   Temp 98.1 F (36.7 C) (Oral)   Resp 18   Ht 5\' 6"  (1.676 m)   Wt  117.9 kg   LMP 05/23/2020 (Approximate)   SpO2 97%   BMI 41.95 kg/m   Constitutional: Well developed, well nourished, no acute distress Eyes:  EOMI, conjunctiva normal bilaterally HENT: Normocephalic, atraumatic,mucus membranes moist Respiratory: Normal inspiratory effort Cardiovascular: Normal rate GI: nondistended.  Positive suprapubic, bilateral flank tenderness.  No guarding, rebound.  Very mild left lower quadrant tenderness.  Patient states that her pain is located in the suprapubic region. Back: No CVAT skin: No rash, skin intact Musculoskeletal: no deformities Neurologic: Alert & oriented x 3, no focal neuro deficits Psychiatric: Speech and behavior appropriate   ED Course   Medications - No data to display  Orders Placed This Encounter  Procedures  . Urine culture    Standing Status:   Standing    Number of Occurrences:   1  . Urinalysis, Complete w Microscopic    Standing Status:   Standing    Number of Occurrences:   1  . Strain all urine    Send pt home with urine strainer    Results for orders placed or performed during the hospital encounter of 06/16/20 (from the past 24 hour(s))  Urinalysis, Complete w Microscopic     Status: Abnormal   Collection Time: 06/16/20  9:08 AM  Result Value Ref Range   Color, Urine ORANGE (A)  YELLOW   APPearance HAZY (A) CLEAR   Specific Gravity, Urine  1.005 - 1.030    TEST NOT REPORTED DUE TO COLOR INTERFERENCE OF URINE PIGMENT   pH  5.0 - 8.0    TEST NOT REPORTED DUE TO COLOR INTERFERENCE OF URINE PIGMENT   Glucose, UA (A) NEGATIVE mg/dL    TEST NOT REPORTED DUE TO COLOR INTERFERENCE OF URINE PIGMENT   Hgb urine dipstick (A) NEGATIVE    TEST NOT REPORTED DUE TO COLOR INTERFERENCE OF URINE PIGMENT   Bilirubin Urine (A) NEGATIVE    TEST NOT REPORTED DUE TO COLOR INTERFERENCE OF URINE PIGMENT   Ketones, ur (A) NEGATIVE mg/dL    TEST NOT REPORTED DUE TO COLOR INTERFERENCE OF URINE PIGMENT   Protein, ur (A) NEGATIVE mg/dL     TEST NOT REPORTED DUE TO COLOR INTERFERENCE OF URINE PIGMENT   Nitrite (A) NEGATIVE    TEST NOT REPORTED DUE TO COLOR INTERFERENCE OF URINE PIGMENT   Leukocytes,Ua (A) NEGATIVE    TEST NOT REPORTED DUE TO COLOR INTERFERENCE OF URINE PIGMENT   Squamous Epithelial / LPF 0-5 0 - 5   WBC, UA 0-5 0 - 5 WBC/hpf   RBC / HPF 11-20 0 - 5 RBC/hpf   Bacteria, UA RARE (A) NONE SEEN   Mucus PRESENT    Ca Oxalate Crys, UA PRESENT    No results found.  ED Clinical Impression  1. Dysuria   2. Calcium oxalate crystals in urine      ED Assessment/Plan  Limited results due to Azo.  However UA with rare bacteria.  No WBCs.  Does have hematuria and calcium oxalate crystals.  I suspect that this is a kidney stone.  Will send home with Pyridium, Flomax.  Will send urine off for culture prior to initiating antibiotic treatment.  Follow-up with PMD as needed.  To the ER if she gets worse.  Discussed labs,  MDM, treatment plan, and plan for follow-up with patient. Discussed sn/sx that should prompt return to the ED. patient agrees with plan.   Meds ordered this encounter  Medications  . ibuprofen (ADVIL) 600 MG tablet    Sig: Take 1 tablet (600 mg total) by mouth every 6 (six) hours as needed.    Dispense:  30 tablet    Refill:  0  . tamsulosin (FLOMAX) 0.4 MG CAPS capsule    Sig: Take 1 capsule (0.4 mg total) by mouth at bedtime for 7 days.    Dispense:  7 capsule    Refill:  0    *This clinic note was created using Scientist, clinical (histocompatibility and immunogenetics). Therefore, there may be occasional mistakes despite careful proofreading.   ?    Domenick Gong, MD 06/17/20 1136

## 2020-06-18 LAB — URINE CULTURE: Culture: 30000 — AB

## 2020-06-21 ENCOUNTER — Telehealth (HOSPITAL_COMMUNITY): Payer: Self-pay | Admitting: Emergency Medicine

## 2020-06-21 MED ORDER — NITROFURANTOIN MONOHYD MACRO 100 MG PO CAPS: 100.0000 mg | ORAL_CAPSULE | Freq: Two times a day (BID) | ORAL | 0 refills | Status: AC

## 2020-06-21 NOTE — Telephone Encounter (Signed)
Per Dr. Chaney Malling, will treat for UTI based off of culture.  Macrobid sent to pharmacy of request, patient made aware.
